# Patient Record
Sex: Male | Born: 1985 | Race: Black or African American | Hispanic: No | Marital: Single | Smoking: Never smoker
Health system: Southern US, Community
[De-identification: ages and names within clinical notes are randomized; demographics above are authoritative.]

## PROBLEM LIST (undated history)

## (undated) DIAGNOSIS — Z889 Allergy status to unspecified drugs, medicaments and biological substances status: Secondary | ICD-10-CM

## (undated) HISTORY — PX: WISDOM TOOTH EXTRACTION: SHX21

## (undated) HISTORY — DX: Allergy status to unspecified drugs, medicaments and biological substances: Z88.9

---

## 2012-11-28 ENCOUNTER — Encounter (HOSPITAL_COMMUNITY): Payer: Self-pay | Admitting: Emergency Medicine

## 2012-11-28 ENCOUNTER — Emergency Department (HOSPITAL_COMMUNITY): Payer: Self-pay

## 2012-11-28 ENCOUNTER — Emergency Department (HOSPITAL_COMMUNITY)
Admission: EM | Admit: 2012-11-28 | Discharge: 2012-11-28 | Disposition: A | Discharge status: Home / Self Care | Payer: Self-pay | Attending: Emergency Medicine | Admitting: Emergency Medicine

## 2012-11-28 DIAGNOSIS — S9031XA Contusion of right foot, initial encounter: Secondary | ICD-10-CM

## 2012-11-28 DIAGNOSIS — W208XXA Other cause of strike by thrown, projected or falling object, initial encounter: Secondary | ICD-10-CM | POA: Insufficient documentation

## 2012-11-28 DIAGNOSIS — Y9389 Activity, other specified: Secondary | ICD-10-CM | POA: Insufficient documentation

## 2012-11-28 DIAGNOSIS — S9030XA Contusion of unspecified foot, initial encounter: Secondary | ICD-10-CM | POA: Insufficient documentation

## 2012-11-28 DIAGNOSIS — Y929 Unspecified place or not applicable: Secondary | ICD-10-CM | POA: Insufficient documentation

## 2012-11-28 MED ORDER — HYDROCODONE-ACETAMINOPHEN 5-325 MG PO TABS: 2.0000 | ORAL_TABLET | Freq: Four times a day (QID) | ORAL | Status: DC | PRN

## 2012-11-28 NOTE — Discharge Instructions (Signed)
Contusion  A contusion is a deep bruise. Contusions happen when an injury causes bleeding under the skin. Signs of bruising include pain, puffiness (swelling), and discolored skin. The contusion may turn blue, purple, or yellow.  HOME CARE    Put ice on the injured area.   Put ice in a plastic bag.   Place a towel between your skin and the bag.   Leave the ice on for 15-20 minutes, 3-4 times a day.   Only take medicine as told by your doctor.   Rest the injured area.   If possible, raise (elevate) the injured area to lessen puffiness.  GET HELP RIGHT AWAY IF:    You have more bruising or puffiness.   You have pain that is getting worse.   Your puffiness or pain is not helped by medicine.  MAKE SURE YOU:    Understand these instructions.   Will watch your condition.   Will get help right away if you are not doing well or get worse.  Document Released: 07/02/2007 Document Revised: 04/07/2011 Document Reviewed: 11/18/2010  [employer] Patient Information 2014 [employer]
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
## 2012-11-28 NOTE — ED Provider Notes (Signed)
Medical screening examination/treatment/procedure(s) were performed by non-physician practitioner and as supervising physician I was immediately available for consultation/collaboration.  EKG Interpretation   None        Keyaira Clapham, MD 11/28/12 1600 
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
## 2014-03-06 ENCOUNTER — Encounter (HOSPITAL_COMMUNITY): Payer: Self-pay | Admitting: Emergency Medicine

## 2014-03-06 DIAGNOSIS — R0982 Postnasal drip: Secondary | ICD-10-CM | POA: Insufficient documentation

## 2014-03-06 DIAGNOSIS — R05 Cough: Secondary | ICD-10-CM | POA: Insufficient documentation

## 2014-03-06 DIAGNOSIS — R0981 Nasal congestion: Secondary | ICD-10-CM | POA: Insufficient documentation

## 2014-03-06 DIAGNOSIS — J3489 Other specified disorders of nose and nasal sinuses: Secondary | ICD-10-CM | POA: Insufficient documentation

## 2014-03-06 DIAGNOSIS — J029 Acute pharyngitis, unspecified: Secondary | ICD-10-CM | POA: Insufficient documentation

## 2014-03-06 LAB — RAPID STREP SCREEN (MED CTR MEBANE ONLY): STREPTOCOCCUS, GROUP A SCREEN (DIRECT): NEGATIVE

## 2014-03-06 NOTE — ED Notes (Signed)
Pt. reports sore throat for 2 weeks with occasional productive cough , respirations unlabored / airway intact , no fever or chills.

## 2014-03-07 ENCOUNTER — Emergency Department (HOSPITAL_COMMUNITY)
Admission: EM | Admit: 2014-03-07 | Discharge: 2014-03-07 | Disposition: A | Discharge status: Home / Self Care | Payer: Self-pay | Attending: Emergency Medicine | Admitting: Emergency Medicine

## 2014-03-07 DIAGNOSIS — J029 Acute pharyngitis, unspecified: Secondary | ICD-10-CM

## 2014-03-07 DIAGNOSIS — R0982 Postnasal drip: Secondary | ICD-10-CM

## 2014-03-07 MED ORDER — HYDROCODONE-ACETAMINOPHEN 7.5-325 MG/15ML PO SOLN
5.0000 mL | Freq: Once | ORAL | Status: AC
  Administered 2014-03-07: 5 mL via ORAL
  Filled 2014-03-07: qty 15

## 2014-03-07 MED ORDER — BENZOCAINE 20 % MT SOLN: 1.0000 "application " | Freq: Three times a day (TID) | OROMUCOSAL | Status: DC | PRN

## 2014-03-07 MED ORDER — FLUTICASONE PROPIONATE 50 MCG/ACT NA SUSP: 2.0000 | Freq: Every day | NASAL | Status: DC

## 2014-03-07 NOTE — ED Notes (Signed)
Patient is alert and orientedx4.  Patient was explained discharge instructions and they understood them with no questions.  The patient's friend, Cyril LoosenMoneisha Hamilton is taking the patient home.

## 2014-03-07 NOTE — ED Provider Notes (Signed)
CSN: [id number]     Arrival date & time 03/06/14  2301 History   First MD Initiated Contact with Patient 03/07/14 0010     Chief Complaint  Patient presents with  . Sore Throat     (Consider location/radiation/quality/duration/timing/severity/associated sxs/prior Treatment) The history is provided by the patient and medical records. No language interpreter was used.   Jordan Tran is a 29 y.o. male  with no major medical history presents to the Emergency Department complaining of gradual, persistent, sinus congestion, intermittent productive cough and sore throat onset 3 weeks ago.  She reports that at the onset he had multiple URI symptoms including rhinorrhea, sore throat, cough, otalgia, subjective chills. He reports that the symptoms lasted approximately one week and then resolved, but his sore throat and postnasal drip has persisted along with his intermittent cough.  She reports trying over-the-counter treatments without relief. He reports ibuprofen without relief from his sore throat. He has not tried nasal spray or Mucinex. Nothing seems to make his symptoms better or worse. Patient denies fever, chills, headache, neck pain, neck stiffness, chest pain, shortness of breath, nausea, vomiting, diarrhea, weakness.     History reviewed. No pertinent past medical history. History reviewed. No pertinent past surgical history. Family History  Problem Relation Age of Onset  . Hypertension Other   . Diabetes Other    History  Substance Use Topics  . Smoking status: Never Smoker   . Smokeless tobacco: Not on file  . Alcohol Use: Yes    Review of Systems  Constitutional: Negative for fever, chills, appetite change and fatigue.  HENT: Positive for congestion, postnasal drip and sore throat. Negative for ear discharge, ear pain, mouth sores, rhinorrhea and sinus pressure.   Eyes: Negative for visual disturbance.  Respiratory: Positive for cough (productive). Negative for chest tightness,  shortness of breath, wheezing and stridor.   Cardiovascular: Negative for chest pain, palpitations and leg swelling.  Gastrointestinal: Negative for nausea, vomiting, abdominal pain and diarrhea.  Genitourinary: Negative for dysuria, urgency, frequency and hematuria.  Musculoskeletal: Negative for myalgias, back pain, arthralgias and neck stiffness.  Skin: Negative for rash.  Neurological: Negative for syncope, light-headedness, numbness and headaches.  Hematological: Negative for adenopathy.  Psychiatric/Behavioral: The patient is not nervous/anxious.   All other systems reviewed and are negative.     Allergies  Review of patient's allergies indicates no known allergies.  Home Medications   Prior to Admission medications   Medication Sig Start Date End Date Taking? Authorizing Provider  benzocaine (HURRICAINE) 20 % SOLN Use as directed 1 application in the mouth or throat 3 (three) times daily as needed. As needed for sore throat 03/07/14   Dahlia Client Wyat Infinger, PA-C  fluticasone (FLONASE) 50 MCG/ACT nasal spray Place 2 sprays into both nostrils daily. 03/07/14   Dyasia Firestine, PA-C  HYDROcodone-acetaminophen (NORCO/VICODIN) 5-325 MG per tablet Take 2 tablets by mouth every 6 (six) hours as needed for pain. 11/28/12   Teressa Lower, NP   BP 153/99 mmHg  Pulse 90  Temp(Src) 98.4 F (36.9 C)  Resp 20  SpO2 92% Physical Exam  Constitutional: He is oriented to person, place, and time. He appears well-developed and well-nourished. No distress.  HENT:  Head: Normocephalic and atraumatic.  Right Ear: Tympanic membrane, external ear and ear canal normal.  Left Ear: Tympanic membrane, external ear and ear canal normal.  Nose: Mucosal edema and rhinorrhea present. No epistaxis. Right sinus exhibits no maxillary sinus tenderness and no frontal sinus tenderness. Left sinus exhibits  no maxillary sinus tenderness and no frontal sinus tenderness.  Mouth/Throat: Uvula is midline and mucous  membranes are normal. Mucous membranes are not pale, not dry and not cyanotic. No trismus in the jaw. No uvula swelling. Posterior oropharyngeal erythema present. No oropharyngeal exudate, posterior oropharyngeal edema or tonsillar abscesses.  Mild posterior oropharyngeal erythema without edema or exudate  Eyes: Conjunctivae are normal. Pupils are equal, round, and reactive to light.  Neck: Normal range of motion, full passive range of motion without pain and phonation normal. No tracheal tenderness, no spinous process tenderness and no muscular tenderness present. No rigidity. No erythema and normal range of motion present. No Brudzinski's sign and no Kernig's sign noted.  Range of motion without pain no No midline or paraspinal tenderness Normal phonation No stridor Handling secretions without difficulty No nuchal rigidity or meningeal signs  Cardiovascular: Normal rate, regular rhythm, normal heart sounds and intact distal pulses.   Pulses:      Radial pulses are 2+ on the right side, and 2+ on the left side.  Pulmonary/Chest: Effort normal and breath sounds normal. No stridor. No respiratory distress. He has no decreased breath sounds. He has no wheezes.  Clear and equal breath sounds without focal wheezes, rhonchi, rales  Abdominal: Soft. Bowel sounds are normal. There is no tenderness.  Musculoskeletal: Normal range of motion.  Lymphadenopathy:       Head (right side): No submental, no submandibular, no tonsillar, no preauricular, no posterior auricular and no occipital adenopathy present.       Head (left side): No submental, no submandibular, no tonsillar, no preauricular, no posterior auricular and no occipital adenopathy present.    He has no cervical adenopathy.       Right cervical: No superficial cervical, no deep cervical and no posterior cervical adenopathy present.      Left cervical: No superficial cervical, no deep cervical and no posterior cervical adenopathy present.   Neurological: He is alert and oriented to person, place, and time.  Alert and oriented Moves all extremities without ataxia  Skin: Skin is warm and dry. No rash noted. He is not diaphoretic.  Psychiatric: He has a normal mood and affect.  Nursing note and vitals reviewed.   ED Course  Procedures (including critical care time) Labs Review Labs Reviewed  RAPID STREP SCREEN  CULTURE, GROUP A STREP    Imaging Review No results found.   EKG Interpretation None      MDM   Final diagnoses:  Post-nasal drip  Sore throat   Jordan Tran presents with persistent postnasal drip, sore throat and occasional productive cough approximately 2 weeks after URI symptoms.  patient without lateralizing sinus tenderness. No concern for acute sinusitis. Patient is afebrile, lung sounds are clear and equal, doubt pneumonia. Patient's throat with mild erythema, it appears irritated. Patient with persistent postnasal drip, occasional productive cough with persistent nasal drip likely secondary to his URI. Discussed that postnasal drip and cough from bronchitis or URI can be persistent. No evidence of acute infection. No indication for antibiotics.    I have personally reviewed patient's vitals, nursing note and any pertinent labs or imaging.  I performed an focused physical exam; undressed when appropriate .    It has been determined that no acute conditions requiring further emergency intervention are present at this time. The patient/guardian have been advised of the diagnosis and plan. I reviewed any labs and imaging including any potential incidental findings. We have discussed signs and symptoms that warrant  return to the ED and they are listed in the discharge instructions.    Vital signs are stable at discharge.   BP 153/99 mmHg  Pulse 90  Temp(Src) 98.4 F (36.9 C)  Resp 20  SpO2 92%     Dierdre Forth, PA-C 03/07/14 [id number]  Gwyneth Sprout, MD 03/07/14 [phone]

## 2014-03-07 NOTE — Discharge Instructions (Signed)
1. Medications: flonase, hurricane gel, mucinex,usual home medications 2. Treatment: rest, drink plenty of fluids, take tylenol or ibuprofen for fever control 3. Follow Up: Please followup with your primary doctor in 3 days for discussion of your diagnoses and further evaluation after today's visit; if you do not have a primary care doctor use the resource guide provided to find one; Return to the ER for high fevers, difficulty breathing or other concerning symptoms   Emergency Department Resource Guide 1) Find a Doctor and Pay Out of Pocket Although you won't have to find out who is covered by your insurance plan, it is a good idea to ask around and get recommendations. You will then need to call the office and see if the doctor you have chosen will accept you as a new patient and what types of options they offer for patients who are self-pay. Some doctors offer discounts or will set up payment plans for their patients who do not have insurance, but you will need to ask so you aren't surprised when you get to your appointment.  2) Contact Your Local Health Department Not all health departments have doctors that can see patients for sick visits, but many do, so it is worth a call to see if yours does. If you don't know where your local health department is, you can check in your phone book. The CDC also has a tool to help you locate your state's health department, and many state websites also have listings of all of their local health departments.  3) Find a Walk-in Clinic If your illness is not likely to be very severe or complicated, you may want to try a walk in clinic. These are popping up all over the country in pharmacies, drugstores, and shopping centers. They're usually staffed by nurse practitioners or physician assistants that have been trained to treat common illnesses and complaints. They're usually fairly quick and inexpensive. However, if you have serious medical issues or chronic medical  problems, these are probably not your best option.  No Primary Care Doctor: - Call Health Connect at  [phone] - they can help you locate a primary care doctor that  accepts your insurance, provides certain services, etc. - Physician Referral Service- [phone]  Chronic Pain Problems: Organization         Address  Phone   Notes  [facility]  [phone] Patients need to be referred by their primary care doctor.   Medication Assistance: Organization         Address  Phone   Notes  [facility] [address] [place], [place] [address] [phone] --Must be a resident of [facility] -- Must have NO insurance coverage whatsoever (no Medicaid/ Medicare, etc.) -- The pt. MUST have a primary care doctor that directs their care regularly and follows them in the community   Med[facility]ist  [phone]   [employer]  [phone]    Agencies that provide inexpensive medical care: Organization         Address  Phone   Notes  [facility]  [phone]) [phone]   [facility]    [phone]   [facility] [address] [place], [place] [address] [phone]   [facility] [address], [place] [phone]) [phone]   [facility]    [phone]   [facility]    [phone]  [facility] [place]  [place] [address], [place] Phone:  [phone], Fax:  [phone] Hours of Operation:  9 am - 6 pm, M-F.  Also accepts Medicaid/Medicare and self-pay.  [facility] for [place] [address], [address], [place] Phone: [phone], Fax: [phone]. Hours of Operation:  8:30 am - 5:30 pm, M-F.  Also accepts Medicaid and self-pay.  [facility] [place] [address] [place] Phone: [phone]   [place], [place], [place] [phone], Ext. [phone]  Mondays & Thursdays: 7-9 AM.  First 15 patients are seen on a first come, first serve basis.    [place] Providers:  Organization         Address  Phone   Notes  [facility] [address], [place] [phone] Also accepts self-pay patients.  [facility] [address], [place]  [phone]   [place], [address], [place] [phone]   [facility] [address], [place] [phone]   Lucianne Lei [address], [place]   [phone] Only accepts [place] Access [place] patients after they have their name applied to their card.   Self-Pay (no insurance) in [facility]:  Organization         Address  Phone   Notes  Sickle Cell Patients, [facility] [place] [phone]   [facility] [place] [phone]   [facility] [place]  [place], [place], [place] [phone]   [facility]/Dr. Osei-Bonsu  [address] [place] or [address], [place] [phone] Phone number for both [place] and [place] locations is the same.  [facility] [address], [place] [phone]   [facility] [place] [address], [place] or [address] [phone]   [facility] [address] [place] [phone], phone; [phone], fax Sees patients 1st and 3rd Saturday of every month.  Must not qualify for public or private insurance (i.e. Medicaid, Medicare, [place] Health Choice, Veterans' Benefits)  Household income should be no more than 200% of the poverty level The clinic cannot treat you if you are pregnant or think you are pregnant  Sexually transmitted diseases are not treated at  the clinic.    Dental Care: Organization         Address  Phone  Notes  [facility] [place] [phone] Accepts children up to age 25 who are enrolled in [place] or [place]; pregnant women with a Medicaid card; and children who have applied for Medicaid or [place] Health Choice, but were declined, whose parents can pay a reduced fee at time of service.  [facility] Department of [facility]  [address], [place] [phone] Accepts children up to age 67 who are enrolled in [place] or [place]; pregnant women with a Medicaid card; and children who have applied for Medicaid or  Health Choice, but were declined, whose parents can pay a reduced fee at time of service.  [place]  [phone]  [place] [phone] Patients are seen by appointment only. Walk-ins are not accepted. [place] will see patients 46 years of age and older. Monday - Tuesday (8am-5pm) Most Wednesdays (8:30-5pm) $30 per visit, cash only  [facility] Adult [facility] Access PROGRAM  [address], [facility] [phone] Patients are seen by appointment only. Walk-ins are not accepted. [place] will see patients 10 years of age and older. One Wednesday Evening (Monthly: Volunteer Based).  $30 per visit, cash only  [place]  [phone] for adults; Children under age 30, call Graduate Pediatric Dentistry at [phone]. Children aged 7-14, please call [phone] to request a pediatric application.  Dental services are provided in all areas of dental care including fillings, crowns and bridges, complete and partial dentures, implants, gum treatment, root canals, and extractions. Preventive care is also provided. Treatment is provided to both adults and children. Patients are selected via a lottery and there is often a  waiting list.   [facility] [address], [place]  [phone] [email or web address]   [facility] [address] [place], [place] [phone], Ext. [phone] Second and Fourth Thursday of each month, opens at 6:30 AM; Clinic ends at 9 AM.  Patients are seen on a first-come first-served basis, and a limited number are seen during each clinic.   [facility]  [address] [place] D[place]er [place], [place] [phone]   Eligibility Requirements You must have lived in [place], [place], or [place] counties for at least the last three months.   You cannot be eligible for state or federal sponsored [employer], including [employer], [place], or [employer].   You generally cannot be eligible for healthcare insurance through your employer.    How to apply: Eligibility screenings are held every Tuesday and Wednesday afternoon from 1:00 pm until 4:00 pm. You do not need an appointment for the interview!  [facility] [address] [place], [place]   [place]  [place] Department  [place]  [phone]    Behavioral Health Resources in the Community: Intensive Outpatient Programs Organization         Address  Phone  Notes  [place] [place], [place] [phone]   [facility] [address] [place], [place]   [facility]: [facility] [address] [place], [place]   [place] [address]  [place], [place] or [phone]   Substance Abuse Resources Organization         Address  Phone  Notes  [facility]  [phone]   [place]  [phone]   [facility] [place]   Chinita Pester  [phone]   Residential & Outpatient Substance Abuse  Program  [phone]   Psychological Services Organization         Address  Phone  Notes  [facility] [place]  [place]  [phone]   [place] [address], [place] or [phone]    Mobile Crisis Teams Organization         Address  Phone  Notes  [facility], Mobile Crisis Care Unit  [phone]   [facility]  [address] [place], [place]  Sharon DeEsch [address] °[place] [phone]   ° °Self-Help/Support Groups °Organization         Address  Phone             Notes  °[facility] [place] - variety of support groups  336- [phone] Call for more information  °Narcotics Anonymous (NA), [facility] [address], °[place]  2 meetings at this location  ° °Residential Treatment Programs °Organization         Address  Phone  Notes  °[facility] [address],    °[place]  [phone]   °[facility] ° [address], [place], [place] [phone]   °[facility] [address], [place] [phone] Admissions: 8am-3pm M-F  °[facility] [address]    °[place], [place] [phone]   °[facility] [address], [place], [place] [phone]   °[facility] [address]  °[place], [place] [phone]   °[facility] - Intensive Outpatient [address], [place], [place] [phone]   °[facility] [address]  °[place], [place] [phone] or [phone]   °[facility] ([facility]) [address] [place], [place] [phone] Accepts Medicaid  °[facility] [address]  °[place] 1-800-659-3381 Substance Abuse/Addiction Treatment  ° °[place] Behavioral Health Resources °Organization          Address  Phone  Notes  °[facility]  ([phone]) [phone]   °Julie Brannon, PhD [address] [place], [place]   (336) [phone] or (336) [phone]   °[facility]   [address] °[place], [place] ([phone]) [phone]   °[facility] [address], [place], [place] ([phone]) [phone] Insurance/Medicaid/sponsorship through [facility]  °[facility] [address]                                    [place], [place] [phone]) [phone] Therapy/tele-psych/case  °[facility] [address]  ° [place], [place] ([phone]) [phone]    °Dr. Arfeen  ([phone]) [phone]   °[facility] [place]  [employer] [place] [facility] 1) [address], [place] °2) [address], [place] °3)  [address], [place] (336) [phone] °(336) [phone] ° °(336) [phone]   °[place] Child Abuse Hotline (336) 342-1394 or (336) [phone] (After Hours)    ° ° ° ° °

## 2014-03-08 LAB — CULTURE, GROUP A STREP

## 2014-08-15 ENCOUNTER — Emergency Department (HOSPITAL_COMMUNITY)
Admission: EM | Admit: 2014-08-15 | Discharge: 2014-08-15 | Disposition: A | Discharge status: Home / Self Care | Payer: Self-pay | Attending: Emergency Medicine | Admitting: Emergency Medicine

## 2014-08-15 ENCOUNTER — Encounter (HOSPITAL_COMMUNITY): Payer: Self-pay | Admitting: Emergency Medicine

## 2014-08-15 DIAGNOSIS — L03114 Cellulitis of left upper limb: Secondary | ICD-10-CM | POA: Insufficient documentation

## 2014-08-15 DIAGNOSIS — L309 Dermatitis, unspecified: Secondary | ICD-10-CM | POA: Insufficient documentation

## 2014-08-15 MED ORDER — CEPHALEXIN 500 MG PO CAPS: 500.0000 mg | ORAL_CAPSULE | Freq: Four times a day (QID) | ORAL | Status: DC

## 2014-08-15 NOTE — Discharge Instructions (Signed)
Take your antibiotics as directed. Please follow-up with your primary care for further valuation management of your symptoms. Return to ED for any worsening symptoms.  Cellulitis Cellulitis is an infection of the skin and the tissue beneath it. The infected area is usually red and tender. Cellulitis occurs most often in the arms and lower legs.  CAUSES  Cellulitis is caused by bacteria that enter the skin through cracks or cuts in the skin. The most common types of bacteria that cause cellulitis are staphylococci and streptococci. SIGNS AND SYMPTOMS   Redness and warmth.  Swelling.  Tenderness or pain.  Fever. DIAGNOSIS  Your health care provider can usually determine what is wrong based on a physical exam. Blood tests may also be done. TREATMENT  Treatment usually involves taking an antibiotic medicine. HOME CARE INSTRUCTIONS   Take your antibiotic medicine as directed by your health care provider. Finish the antibiotic even if you start to feel better.  Keep the infected arm or leg elevated to reduce swelling.  Apply a warm cloth to the affected area up to 4 times per day to relieve pain.  Take medicines only as directed by your health care provider.  Keep all follow-up visits as directed by your health care provider. SEEK MEDICAL CARE IF:   You notice red streaks coming from the infected area.  Your red area gets larger or turns dark in color.  Your bone or joint underneath the infected area becomes painful after the skin has healed.  Your infection returns in the same area or another area.  You notice a swollen bump in the infected area.  You develop new symptoms.  You have a fever. SEEK IMMEDIATE MEDICAL CARE IF:   You feel very sleepy.  You develop vomiting or diarrhea.  You have a general ill feeling (malaise) with muscle aches and pains. MAKE SURE YOU:   Understand these instructions.  Will watch your condition.  Will get help right away if you are  not doing well or get worse. Document Released: 10/23/2004 Document Revised: 05/30/2013 Document Reviewed: 03/31/2011 [facility] Patient Information 2015 [place], [place]. This information is not intended to replace advice given to you by your health care provider. Make sure you discuss any questions you have with your health care provider.

## 2014-08-15 NOTE — ED Notes (Signed)
Pt. reports insect bite at left forearm yesterday with itching/swelling and mild drainage .

## 2014-08-15 NOTE — ED Provider Notes (Signed)
CSN: [id number]     Arrival date & time 08/15/14  2140 History  This chart was scribed for Joycie Peek, PA-C, working with Margarita Grizzle, MD by Chestine Spore, ED Scribe. The patient was seen in room TR03C/TR03C at 10:07 PM.    Chief Complaint  Patient presents with  . Insect Bite     The history is provided by the patient. No language interpreter was used.    HPI Comments: Jordan Tran is a 29 y.o. male who presents to the Emergency Department complaining of insect bite onset 3 days. Pt reports that he initally had itching as his symptoms that he scratched the area and had a resulting "mound" to his arm. Pt notes that he thinks that he was bit by an insect. Pt notes that he bumped into a wall and accidentally opened up the wound and it has been draining since. He states that he has tried neosporin with no relief for his symptoms. He denies fever, chills, abdominal pain, n/v, and any other symptoms. Denies medical hx of DM or HTN. Denies allergies to any medications.     History reviewed. No pertinent past medical history. History reviewed. No pertinent past surgical history. Family History  Problem Relation Age of Onset  . Hypertension Other   . Diabetes Other    History  Substance Use Topics  . Smoking status: Never Smoker   . Smokeless tobacco: Not on file  . Alcohol Use: Yes    Review of Systems  Constitutional: Negative for fever and chills.  Gastrointestinal: Negative for nausea, vomiting and abdominal pain.  Skin: Positive for color change.       "mound" to left forearm      Allergies  Review of patient's allergies indicates no known allergies.  Home Medications   Prior to Admission medications   Medication Sig Start Date End Date Taking? Authorizing Provider  benzocaine (HURRICAINE) 20 % SOLN Use as directed 1 application in the mouth or throat 3 (three) times daily as needed. As needed for sore throat Patient not taking: Reported on 08/15/2014 03/07/14   [place]ahlia Client  Muthersbaugh, PA-C  cephALEXin (KEFLEX) 500 MG capsule Take 1 capsule (500 mg total) by mouth 4 (four) times daily. 08/15/14   Joycie Peek, PA-C  fluticasone (FLONASE) 50 MCG/ACT nasal spray Place 2 sprays into both nostrils daily. Patient not taking: Reported on 08/15/2014 03/07/14   [place]ia Client Muthersbaugh, PA-C  HYDROcodone-acetaminophen (NORCO/VICODIN) 5-325 MG per tablet Take 2 tablets by mouth every 6 (six) hours as needed for pain. Patient not taking: Reported on 08/15/2014 11/28/12   Teressa Lower, NP   BP 120/76 mmHg  Pulse 89  Temp(Src) 98.1 F (36.7 C) (Oral)  Resp 14  Ht 6' (1.829 m)  Wt 352 lb (159.666 kg)  BMI 47.73 kg/m2  SpO2 97% Physical Exam  Constitutional: He is oriented to person, place, and time. He appears well-developed and well-nourished. No distress.  HENT:  Head: Normocephalic and atraumatic.  Eyes: EOM are normal.  Neck: Neck supple. No tracheal deviation present.  Cardiovascular: Normal rate, regular rhythm and normal heart sounds.  Exam reveals no gallop and no friction rub.   No murmur heard. Pulmonary/Chest: Effort normal and breath sounds normal. No respiratory distress. He has no wheezes. He has no rales.  Abdominal: Soft. There is no tenderness.  Musculoskeletal: Normal range of motion.  Neurological: He is alert and oriented to person, place, and time.  Skin: Skin is warm and dry. There is erythema.  Small raised  lesion, approximately 2 cm in diameter, noted to the anterior aspect of the mid left forearm. Mild induration with surrounding erythema that is tender to touch. No active drainage  Psychiatric: He has a normal mood and affect. His behavior is normal.  Nursing note and vitals reviewed.   ED Course  Procedures (including critical care time) EMERGENCY DEPARTMENT US SOFT TISSUE INTERPRETATION "Study: Limited Ultrasound of the noted body part in comments below"  INDICATIONS: Soft tissue infection Multiple views of the body part are  obtained with a multi-frequency linear probe  PERFORMED BY:  Myself  IMAGES ARCHIVED?: Yes  SIDE:Left  BODY PART:Upper extremity  FINDINGS: No abcess noted  LIMITATIONS:  Body Habitus  INTERPRETATION:  Cellulitis present  COMMENT:  Cellulitis without obvious abscess.   DIAGNOSTIC STUDIES: Oxygen Saturation is 97% on RA, nl by my interpretation.    COORDINATION OF CARE: 10:10 PM-Discussed treatment plan with pt at bedside and pt agreed to plan.   Labs Review Labs Reviewed - No data to display  Imaging Review No results found.   EKG Interpretation None     Meds given in ED:  Medications - No data to display  Discharge Medication List as of 08/15/2014 11:02 PM    START taking these medications   Details  cephALEXin (KEFLEX) 500 MG capsule Take 1 capsule (500 mg total) by mouth 4 (four) times daily., Starting 08/15/2014, Until Discontinued, Print       Filed Vitals:   08/15/14 2148 08/15/14 2305  BP: 144/102 120/76  Pulse: 98 89  Temp: 98.8 F (37.1 C) 98.1 F (36.7 C)  TempSrc: Oral Oral  Resp: 14   Height: 6' (1.829 m)   Weight: 352 lb (159.666 kg)   SpO2: 97% 97%    MDM  Vitals stable - WNL -afebrile Pt resting comfortably in ED. PE--no evidence of drainable abscess on exam. Patient with cellulitis confirmed on ultrasound. Also, clinically with mild cellulitis.  Will DC with antibiotics, instructed to follow-up with primary care for reevaluation. I discussed all relevant lab findings and imaging results with pt and they verbalized understanding. Discussed f/u with PCP within 48 hrs and return precautions, pt very amenable to plan.  Final diagnoses:  Cellulitis of left upper extremity   I personally performed the services described in this documentation, which was scribed in my presence. The recorded information has been reviewed and is accurate.    Joycie PeekBenjamin Charlii Yost, PA-C 08/16/14 [id number]  Margarita Grizzleanielle Ray, MD 08/17/14 [id number]

## 2014-08-15 NOTE — ED Notes (Signed)
A&OX4, ambulatory at d/c with steady gait, NAD

## 2014-10-22 IMAGING — CR DG FOOT COMPLETE 3+V*R*
3 series · 3 of 3 positions shown · non-contrast
Comparison: None.

CLINICAL DATA: Initial encounter for right foot injury, a table
fell on top of the foot, pain localizing to the 3rd through 5th
metatarsals.

EXAM:
RIGHT FOOT COMPLETE - 3+ VIEW

[x foot ap right]
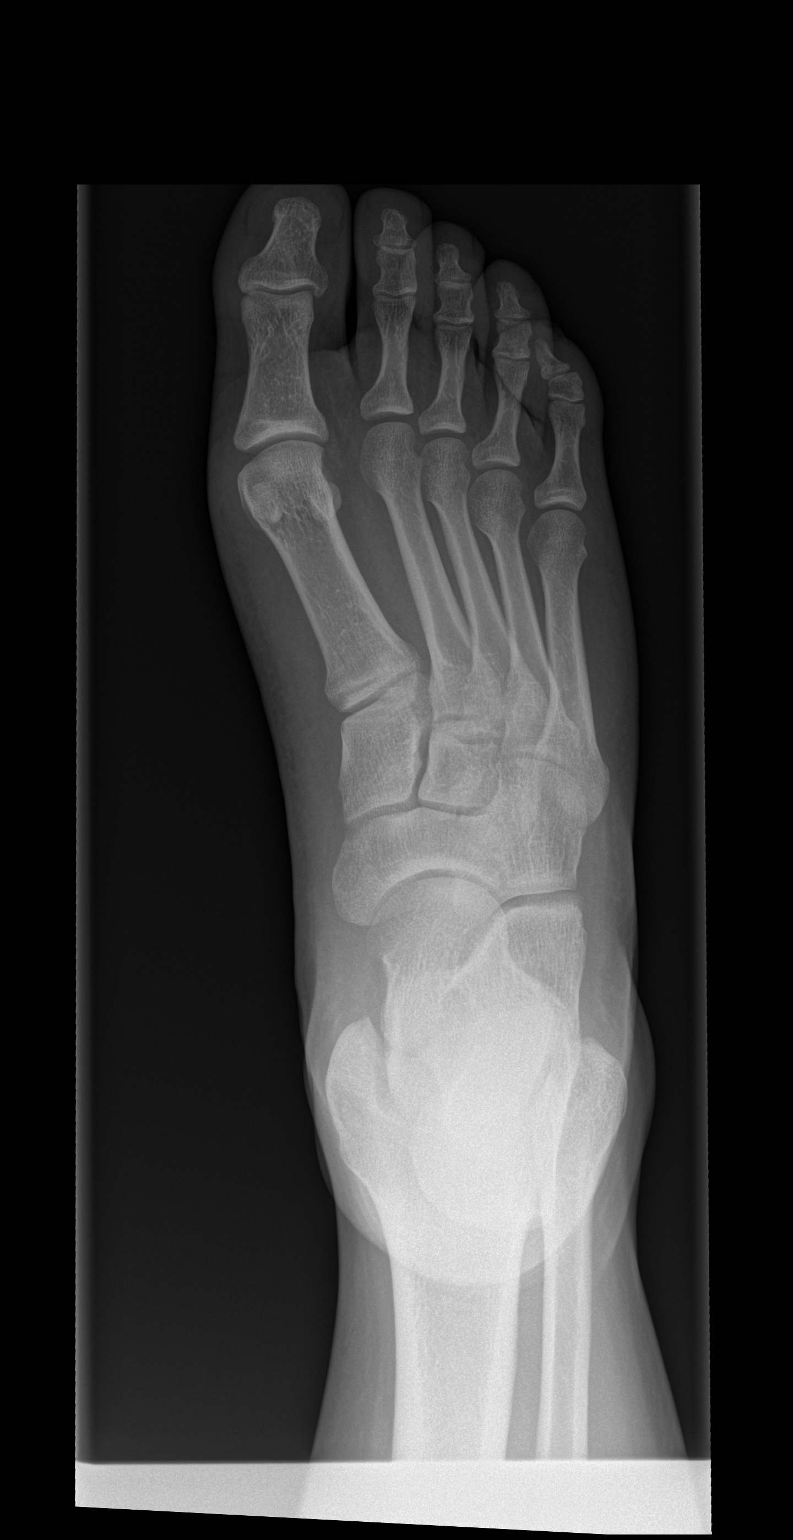

[x foot obl right]
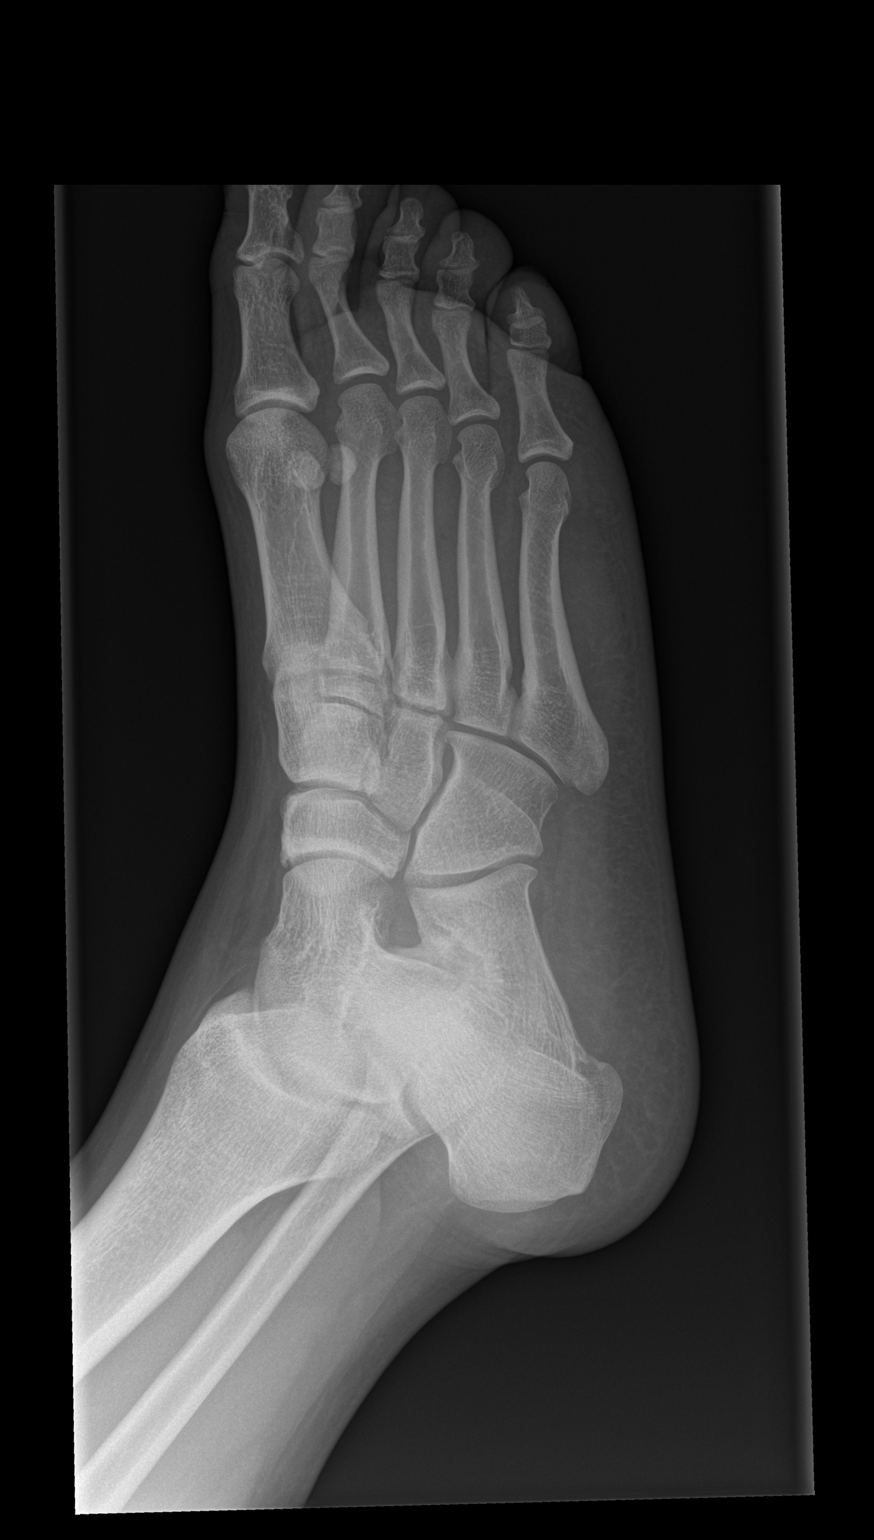

[x foot lat right]
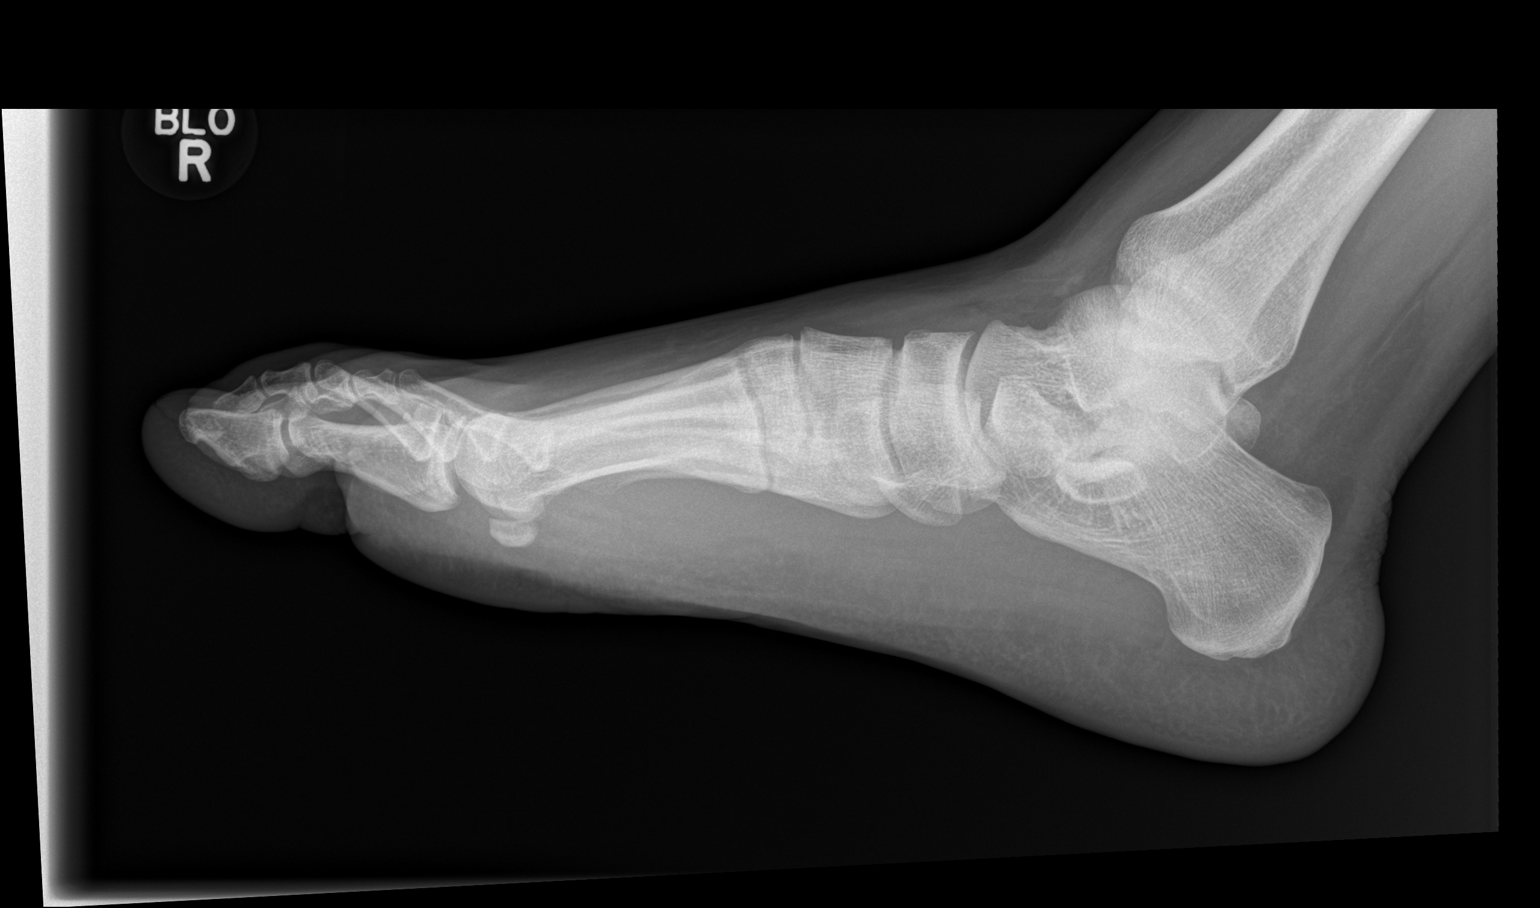

[3 of 3 positions shown; findings below may reference images not displayed]

FINDINGS: No evidence of acute fracture or dislocation. Joint spaces well
preserved. Well-preserved bone mineral density. No intrinsic osseous
abnormalities.
IMPRESSION: Normal examination.

## 2020-03-23 ENCOUNTER — Encounter (HOSPITAL_COMMUNITY): Payer: Self-pay

## 2020-03-23 ENCOUNTER — Emergency Department (HOSPITAL_COMMUNITY)
Admission: EM | Admit: 2020-03-23 | Discharge: 2020-03-23 | Disposition: A | Discharge status: Home / Self Care | Payer: Self-pay | Attending: Emergency Medicine | Admitting: Emergency Medicine

## 2020-03-23 ENCOUNTER — Other Ambulatory Visit: Payer: Self-pay

## 2020-03-23 DIAGNOSIS — L03211 Cellulitis of face: Secondary | ICD-10-CM | POA: Insufficient documentation

## 2020-03-23 MED ORDER — CEPHALEXIN 500 MG PO CAPS: 500.0000 mg | ORAL_CAPSULE | Freq: Three times a day (TID) | ORAL | 0 refills | Status: AC

## 2020-03-23 NOTE — Discharge Instructions (Addendum)
Call your primary care doctor or specialist as discussed in the next 2-3 days.   Return immediately back to the ER if:  Your symptoms worsen within the next 12-24 hours. You develop new symptoms such as new fevers, persistent vomiting, new pain, shortness of breath, or new weakness or numbness, or if you have any other concerns.  

## 2020-03-23 NOTE — ED Triage Notes (Signed)
Pt reports his nose began swelling on Monday and reports taking Benadryl regularly with no relief. Pt also endorses bilateral jaw soreness.

## 2020-03-23 NOTE — ED Provider Notes (Addendum)
[facility]-EMERGENCY DEPT Provider Note   CSN: [id number] Arrival date & time: 03/23/20  1037     History Chief Complaint  Patient presents with  . Facial Swelling    Jordan Tran is a 35 y.o. male.  Patient presents with complaint of swelling and tenderness to tip of his nose.  He states he had a similar symptom about 2 years ago that resolved after few days.  About 2 days ago he states he accidentally scraped the tip of his nose while in the bathroom.  Since then he has noticed increased redness swelling and pain in that region.  Denies fevers or cough or vomiting or diarrhea.        History reviewed. No pertinent past medical history.  There are no problems to display for this patient.   History reviewed. No pertinent surgical history.     Family History  Problem Relation Age of Onset  . Hypertension Other   . Diabetes Other     Social History   Tobacco Use  . Smoking status: Never Smoker  Substance Use Topics  . Alcohol use: Yes  . Drug use: No    Home Medications Prior to Admission medications   Medication Sig Start Date End Date Taking? Authorizing Provider  benzocaine (HURRICAINE) 20 % SOLN Use as directed 1 application in the mouth or throat 3 (three) times daily as needed. As needed for sore throat Patient not taking: Reported on 08/15/2014 03/07/14   Muthersbaugh, Dahlia Client, PA-C  cephALEXin (KEFLEX) 500 MG capsule Take 1 capsule (500 mg total) by mouth 3 (three) times daily for 7 days. 03/23/20 03/30/20  Cheryll Cockayne, MD  fluticasone (FLONASE) 50 MCG/ACT nasal spray Place 2 sprays into both nostrils daily. Patient not taking: Reported on 08/15/2014 03/07/14   Muthersbaugh, Dahlia Client, PA-C  HYDROcodone-acetaminophen (NORCO/VICODIN) 5-325 MG per tablet Take 2 tablets by mouth every 6 (six) hours as needed for pain. Patient not taking: Reported on 08/15/2014 11/28/12   Teressa Lower, NP    Allergies    Patient has no known  allergies.  Review of Systems   Review of Systems  Constitutional: Negative for fever.  HENT: Negative for ear pain and sore throat.   Eyes: Negative for pain.  Respiratory: Negative for cough.   Cardiovascular: Negative for chest pain.  Gastrointestinal: Negative for abdominal pain.  Genitourinary: Negative for flank pain.  Musculoskeletal: Negative for back pain.  Skin: Positive for rash. Negative for color change.  Neurological: Negative for syncope.  All other systems reviewed and are negative.   Physical Exam Updated Vital Signs BP (!) 157/94 (BP Location: Left Arm)   Pulse 86   Temp 98.9 F (37.2 C) (Oral)   Resp 19   SpO2 100%   Physical Exam Constitutional:      General: He is not in acute distress.    Appearance: He is well-developed.  HENT:     Head: Normocephalic.     Nose:     Comments: Erythema at the tip of the nose proximately 1 x 1.5 cm, with increased warmth and mild skin abrasion.  No fluctuant lesion no abscess no vesicle noted. Eyes:     Extraocular Movements: Extraocular movements intact.  Cardiovascular:     Rate and Rhythm: Normal rate.  Pulmonary:     Effort: Pulmonary effort is normal.  Skin:    Coloration: Skin is not jaundiced.  Neurological:     Mental Status: He is alert. Mental status is at baseline.  ED Results / Procedures / Treatments   Labs (all labs ordered are listed, but only abnormal results are displayed) Labs Reviewed - No data to display  EKG None  Radiology No results found.  Procedures Procedures   Medications Ordered in ED Medications - No data to display  ED Course  I have reviewed the triage vital signs and the nursing notes.  Pertinent labs & imaging results that were available during my care of the patient were reviewed by me and considered in my medical decision making (see chart for details).    MDM Rules/Calculators/A&P                          Concern for developing cellulitis in his nose.   Given prescription of antibiotics to go home with.  Advised follow-up with his doctor within 4 to 5 days.  Advised immediate return for worsening symptoms fevers pain or any additional concerns.  Final Clinical Impression(s) / ED Diagnoses Final diagnoses:  Facial cellulitis    Rx / DC Orders ED Discharge Orders         Ordered    cephALEXin (KEFLEX) 500 MG capsule  3 times daily        03/23/20 1131           [place], MD 03/23/20 1131    Cheryll Cockayne, MD 03/23/20 1154

## 2021-12-23 ENCOUNTER — Ambulatory Visit: Payer: Self-pay | Admitting: Physician Assistant

## 2021-12-25 ENCOUNTER — Ambulatory Visit (INDEPENDENT_AMBULATORY_CARE_PROVIDER_SITE_OTHER): Payer: Medicaid Other | Admitting: Physician Assistant

## 2021-12-25 ENCOUNTER — Encounter: Payer: Self-pay | Admitting: Physician Assistant

## 2021-12-25 VITALS — BP 144/94 | HR 85 | Temp 97.8°F | Ht 72.05 in | Wt 299.8 lb

## 2021-12-25 DIAGNOSIS — Z8249 Family history of ischemic heart disease and other diseases of the circulatory system: Secondary | ICD-10-CM

## 2021-12-25 DIAGNOSIS — Z0000 Encounter for general adult medical examination without abnormal findings: Secondary | ICD-10-CM | POA: Diagnosis not present

## 2021-12-25 DIAGNOSIS — R030 Elevated blood-pressure reading, without diagnosis of hypertension: Secondary | ICD-10-CM | POA: Insufficient documentation

## 2021-12-25 NOTE — Patient Instructions (Signed)
Welcome to [facility] at [facility]! It was a pleasure meeting you today.  As discussed, Please schedule a 2 week follow up visit today.  PLEASE NOTE:  If you had any LAB tests please let us know if you have not heard back within a few days. You may see your results on MyChart before we have a chance to review them but we will give you a call once they are reviewed by us. If we ordered any REFERRALS today, please let us know if you have not heard from their office within the next two weeks. Let us know through MyChart if you are needing REFILLS, or have your pharmacy send us the request. You can also use MyChart to communicate with me or any office staff.  Please try these tips to maintain a healthy lifestyle:  Eat most of your calories during the day when you are active. Eliminate processed foods including packaged sweets (pies, cakes, cookies), reduce intake of potatoes, white bread, white pasta, and white rice. Look for whole grain options, oat flour or almond flour.  Each meal should contain half fruits/vegetables, one quarter protein, and one quarter carbs (no bigger than a computer mouse).  Cut down on sweet beverages. This includes juice, soda, and sweet tea. Also watch fruit intake, though this is a healthier sweet option, it still contains natural sugar! Limit to 3 servings daily.  Drink at least 1 glass of water with each meal and aim for at least 8 glasses (64 ounces) per day.  Exercise at least 150 minutes every week to the best of your ability.    Take Care,  Saagar Tortorella, PA-C    

## 2021-12-25 NOTE — Assessment & Plan Note (Signed)
Age-appropriate screening and counseling performed today. Will check labs (next visit, patient needle-phobic) and call with results. Preventive measures discussed and printed in AVS for patient.   Patient Counseling: [x]   Nutrition: Stressed importance of moderation in sodium/caffeine intake, saturated fat and cholesterol, caloric balance, sufficient intake of fresh fruits, vegetables, and fiber.  [x]   Stressed the importance of regular exercise.   [x]   Substance Abuse: Discussed cessation/primary prevention of tobacco, alcohol, or other drug use; driving or other dangerous activities under the influence; availability of treatment for abuse.   []   Injury prevention: Discussed safety belts, safety helmets, smoke detector, smoking near bedding or upholstery.   [x]   Sexuality: Discussed sexually transmitted diseases, partner selection, use of condoms, avoidance of unintended pregnancy  and contraceptive alternatives.   [x]   Dental health: Discussed importance of regular tooth brushing, flossing, and dental visits.  [x]   Health maintenance and immunizations reviewed. Please refer to Health maintenance section.

## 2021-12-25 NOTE — Assessment & Plan Note (Signed)
See family history

## 2021-12-25 NOTE — Progress Notes (Signed)
Subjective:    Patient ID: Jordan Tran, male    DOB: 11-21-1985, 36 y.o.   MRN: [id number]  Chief Complaint  Patient presents with   New Patient (Initial Visit)    New patient in office to establish care with PCP; no concerns to discuss; pt is due for CPE and labs    HPI 36 y.o. patient presents today for new patient establishment with me.  Patient hasn't had PCP in a long time. 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 One 35 year old son in track and basketball.   Current Care Team: None    Acute concerns: Family history of heart disease in father and paternal grandfather.  Health maintenance: Lifestyle/ exercise: Elliptical, lifting Nutrition: Cooking at home mostly, rare eating out Mental health: Doing well Caffeine: 1-2 Cups coffee in the morning; no sodas Sleep: Doing well overall Substance use: None  Sexual activity: No concerns, monogamous Immunizations: Doesn't like needles, declines flu vaccine    Past Medical History:  Diagnosis Date   H/O seasonal allergies     Past Surgical History:  Procedure Laterality Date   WISDOM TOOTH EXTRACTION Bilateral     Family History  Problem Relation Age of Onset   Sickle cell trait Mother    Diabetes Mother    Hypertension Mother    Heart disease Father        ultimately passed from COVID   Sickle cell trait Brother        passed away May 20, 2013 MVA   Diabetes Maternal Grandmother    Kidney disease Maternal Grandmother    Hypertension Maternal Grandmother    Hypertension Paternal Grandfather    Diabetes Paternal Grandfather    Heart disease Paternal Grandfather     Social History   Tobacco Use   Smoking status: Never   Smokeless tobacco: Never  Vaping Use   Vaping Use: Former  Substance Use Topics   Alcohol use: Yes    Comment: occasionally 1 per month   Drug use: No     No Known Allergies  Review of Systems NEGATIVE UNLESS OTHERWISE INDICATED IN HPI      Objective:     BP (!) 144/94 (BP Location: Right  Arm)   Pulse 85   Temp 97.8 F (36.6 C) (Temporal)   Ht 6' 0.05" (1.83 m)   Wt 299 lb 12.8 oz (136 kg)   SpO2 99%   BMI 40.61 kg/m   Wt Readings from Last 3 Encounters:  12/25/21 299 lb 12.8 oz (136 kg)  08/15/14 (!) 352 lb (159.7 kg)    BP Readings from Last 3 Encounters:  12/25/21 (!) 144/94  03/23/20 (!) 157/94  08/15/14 120/76     Physical Exam Vitals and nursing note reviewed.  Constitutional:      General: He is not in acute distress.    Appearance: Normal appearance. He is obese. He is not toxic-appearing.  HENT:     Head: Normocephalic and atraumatic.     Right Ear: Tympanic membrane, ear canal and external ear normal.     Left Ear: Tympanic membrane, ear canal and external ear normal.     Nose: Nose normal.     Mouth/Throat:     Mouth: Mucous membranes are moist.     Pharynx: Oropharynx is clear.  Eyes:     Extraocular Movements: Extraocular movements intact.     Conjunctiva/sclera: Conjunctivae normal.     Pupils: Pupils are equal, round, and reactive to light.  Cardiovascular:  Rate and Rhythm: Normal rate and regular rhythm.     Pulses: Normal pulses.     Heart sounds: Normal heart sounds.  Pulmonary:     Effort: Pulmonary effort is normal.     Breath sounds: Normal breath sounds.  Abdominal:     General: Abdomen is flat. Bowel sounds are normal.     Palpations: Abdomen is soft.     Tenderness: There is no abdominal tenderness.  Musculoskeletal:        General: Normal range of motion.     Cervical back: Normal range of motion and neck supple.  Skin:    General: Skin is warm and dry.  Neurological:     General: No focal deficit present.     Mental Status: He is alert and oriented to person, place, and time.  Psychiatric:        Mood and Affect: Mood normal.        Behavior: Behavior normal.        Assessment & Plan:  Encounter for annual physical exam Assessment & Plan: Age-appropriate screening and counseling performed today. Will  check labs (next visit, patient needle-phobic) and call with results. Preventive measures discussed and printed in AVS for patient.   Patient Counseling: [x]   Nutrition: Stressed importance of moderation in sodium/caffeine intake, saturated fat and cholesterol, caloric balance, sufficient intake of fresh fruits, vegetables, and fiber.  [x]   Stressed the importance of regular exercise.   [x]   Substance Abuse: Discussed cessation/primary prevention of tobacco, alcohol, or other drug use; driving or other dangerous activities under the influence; availability of treatment for abuse.   []   Injury prevention: Discussed safety belts, safety helmets, smoke detector, smoking near bedding or upholstery.   [x]   Sexuality: Discussed sexually transmitted diseases, partner selection, use of condoms, avoidance of unintended pregnancy  and contraceptive alternatives.   [x]   Dental health: Discussed importance of regular tooth brushing, flossing, and dental visits.  [x]   Health maintenance and immunizations reviewed. Please refer to Health maintenance section.        Elevated blood pressure reading in office without diagnosis of hypertension Assessment & Plan: Anxious today, says he usually has high BP at doctor's offices Asked him to try to check outside of our office, bring readings Lifestyle advised - good exercise / diet/ avoid ETOH Consider anti-HTN medication if persistent Monitor / recheck in 2 weeks   Family history of heart disease in male family member before age 42 Assessment & Plan: See family history          Return in about 2 weeks (around 01/08/2022) for BP recheck with Ac Colan and fasting labs .  This note was prepared with assistance of . Occasional wrong-word or sound-a-like substitutions may have occurred due to the inherent limitations of voice recognition software.      Sorcha Rotunno M Patsey Pitstick, PA-C

## 2021-12-25 NOTE — Assessment & Plan Note (Signed)
Anxious today, says he usually has high BP at doctor's offices Asked him to try to check outside of our office, bring readings Lifestyle advised - good exercise / diet/ avoid ETOH Consider anti-HTN medication if persistent Monitor / recheck in 2 weeks

## 2022-01-08 ENCOUNTER — Encounter: Payer: Self-pay | Admitting: Physician Assistant

## 2022-01-08 ENCOUNTER — Ambulatory Visit: Payer: Medicaid Other | Admitting: Physician Assistant

## 2022-01-08 VITALS — BP 126/77 | HR 84 | Temp 97.5°F | Ht 72.05 in | Wt 302.6 lb

## 2022-01-08 DIAGNOSIS — Z130 Encounter for screening for diseases of the blood and blood-forming organs and certain disorders involving the immune mechanism: Secondary | ICD-10-CM

## 2022-01-08 DIAGNOSIS — Z13220 Encounter for screening for lipoid disorders: Secondary | ICD-10-CM | POA: Diagnosis not present

## 2022-01-08 DIAGNOSIS — Z131 Encounter for screening for diabetes mellitus: Secondary | ICD-10-CM

## 2022-01-08 DIAGNOSIS — R030 Elevated blood-pressure reading, without diagnosis of hypertension: Secondary | ICD-10-CM

## 2022-01-08 LAB — CBC WITH DIFFERENTIAL/PLATELET
Basophils Absolute: 0 10*3/uL (ref 0.0–0.1)
Basophils Relative: 0.7 % (ref 0.0–3.0)
Eosinophils Absolute: 0.1 10*3/uL (ref 0.0–0.7)
Eosinophils Relative: 2 % (ref 0.0–5.0)
HCT: 50.1 % (ref 39.0–52.0)
Hemoglobin: 16.9 g/dL (ref 13.0–17.0)
Lymphocytes Relative: 54.4 % — ABNORMAL HIGH (ref 12.0–46.0)
Lymphs Abs: 3.8 10*3/uL (ref 0.7–4.0)
MCHC: 33.7 g/dL (ref 30.0–36.0)
MCV: 86.8 fl (ref 78.0–100.0)
Monocytes Absolute: 0.6 10*3/uL (ref 0.1–1.0)
Monocytes Relative: 8.9 % (ref 3.0–12.0)
Neutro Abs: 2.4 10*3/uL (ref 1.4–7.7)
Neutrophils Relative %: 34 % — ABNORMAL LOW (ref 43.0–77.0)
Platelets: 295 10*3/uL (ref 150.0–400.0)
RBC: 5.77 Mil/uL (ref 4.22–5.81)
RDW: 13.6 % (ref 11.5–15.5)
WBC: 7 10*3/uL (ref 4.0–10.5)

## 2022-01-08 LAB — LIPID PANEL
Cholesterol: 207 mg/dL — ABNORMAL HIGH (ref 0–200)
HDL: 52.8 mg/dL (ref >39.00)
LDL Cholesterol: 133 mg/dL — ABNORMAL HIGH (ref 0–99)
NonHDL: 154.55
Total CHOL/HDL Ratio: 4
Triglycerides: 106 mg/dL (ref 0.0–149.0)
VLDL: 21.2 mg/dL (ref 0.0–40.0)

## 2022-01-08 LAB — COMPREHENSIVE METABOLIC PANEL
ALT: 33 U/L (ref 0–53)
AST: 16 U/L (ref 0–37)
Albumin: 4.6 g/dL (ref 3.5–5.2)
Alkaline Phosphatase: 91 U/L (ref 39–117)
BUN: 10 mg/dL (ref 6–23)
CO2: 29 mEq/L (ref 19–32)
Calcium: 10 mg/dL (ref 8.4–10.5)
Chloride: 103 mEq/L (ref 96–112)
Creatinine, Ser: 1.01 mg/dL (ref 0.40–1.50)
GFR: 96.09 mL/min (ref >60.00)
Glucose, Bld: 100 mg/dL — ABNORMAL HIGH (ref 70–99)
Potassium: 4.9 mEq/L (ref 3.5–5.1)
Sodium: 140 mEq/L (ref 135–145)
Total Bilirubin: 1.2 mg/dL (ref 0.2–1.2)
Total Protein: 7.5 g/dL (ref 6.0–8.3)

## 2022-01-08 LAB — HEMOGLOBIN A1C: Hgb A1c MFr Bld: 6.9 % — ABNORMAL HIGH (ref 4.6–6.5)

## 2022-01-08 NOTE — Progress Notes (Signed)
Subjective:    Patient ID: Jordan Tran, male    DOB: 02-17-1985, 36 y.o.   MRN: [id number]  Chief Complaint  Patient presents with   Follow-up   Hypertension    Hypertension   Patient is in today for fasting labs and recheck BP. Doesn't have a way to check BP at home. Denies any symptoms or changes from last visit.   Past Medical History:  Diagnosis Date   H/O seasonal allergies     Past Surgical History:  Procedure Laterality Date   WISDOM TOOTH EXTRACTION Bilateral     Family History  Problem Relation Age of Onset   Sickle cell trait Mother    Diabetes Mother    Hypertension Mother    Heart disease Father        ultimately passed from COVID   Sickle cell trait Brother        passed away 2013/05/27 MVA   Diabetes Maternal Grandmother    Kidney disease Maternal Grandmother    Hypertension Maternal Grandmother    Hypertension Paternal Grandfather    Diabetes Paternal Grandfather    Heart disease Paternal Grandfather     Social History   Tobacco Use   Smoking status: Never   Smokeless tobacco: Never  Vaping Use   Vaping Use: Former  Substance Use Topics   Alcohol use: Yes    Comment: occasionally 1 per month   Drug use: No     No Known Allergies  Review of Systems NEGATIVE UNLESS OTHERWISE INDICATED IN HPI      Objective:     BP 126/77 (BP Location: Left Arm, Patient Position: Sitting)   Pulse 84   Temp (!) 97.5 F (36.4 C) (Temporal)   Ht 6' 0.05" (1.83 m)   Wt (!) 302 lb 9.6 oz (137.3 kg)   SpO2 97%   BMI 40.98 kg/m   Wt Readings from Last 3 Encounters:  01/08/22 (!) 302 lb 9.6 oz (137.3 kg)  12/25/21 299 lb 12.8 oz (136 kg)  08/15/14 (!) 352 lb (159.7 kg)    BP Readings from Last 3 Encounters:  01/08/22 126/77  12/25/21 (!) 144/94  03/23/20 (!) 157/94     Physical Exam Constitutional:      Appearance: Normal appearance. He is obese.  Cardiovascular:     Rate and Rhythm: Normal rate and regular rhythm.  Pulmonary:     Effort:  Pulmonary effort is normal.     Breath sounds: Normal breath sounds.  Neurological:     Mental Status: He is alert.  Psychiatric:        Mood and Affect: Mood normal.        Behavior: Behavior normal.        Assessment & Plan:  Elevated blood pressure reading in office without diagnosis of hypertension -     CBC with Differential/Platelet -     Comprehensive metabolic panel  Diabetes mellitus screening -     Comprehensive metabolic panel -     Hemoglobin A1c  Screening for cholesterol level -     Lipid panel  Screening for iron deficiency anemia -     CBC with Differential/Platelet   36 year old African-American male presented for recheck of blood pressure today after initial visit showed some elevated readings.  His blood pressure is of normal range.  Encouraged him to keep working on lifestyle changes to keep his blood pressure at goal.  Plan to check some screening fasting labs today and inform him of any  concerns.     Return in about 1 year (around 01/09/2023) for CPE, fasting labs .  This note was prepared with assistance of Conservation officer, historic buildings. Occasional wrong-word or sound-a-like substitutions may have occurred due to the inherent limitations of voice recognition software.     Ewing Fandino M Kadia Abaya, PA-C

## 2022-01-15 ENCOUNTER — Telehealth (INDEPENDENT_AMBULATORY_CARE_PROVIDER_SITE_OTHER): Payer: Medicaid Other | Admitting: Physician Assistant

## 2022-01-15 ENCOUNTER — Encounter: Payer: Self-pay | Admitting: Physician Assistant

## 2022-01-15 DIAGNOSIS — E119 Type 2 diabetes mellitus without complications: Secondary | ICD-10-CM | POA: Diagnosis not present

## 2022-01-15 DIAGNOSIS — E782 Mixed hyperlipidemia: Secondary | ICD-10-CM | POA: Diagnosis not present

## 2022-01-15 NOTE — Progress Notes (Signed)
   Virtual Visit via Video Note  I connected with  Jordan Tran  on 01/15/22 at 11:30 AM EST by a video enabled telemedicine application and verified that I am speaking with the correct person using two identifiers.  Location: Patient: home Provider: Nature conservation officer at [employer] Persons present: Patient and myself   I discussed the limitations of evaluation and management by telemedicine and the availability of in person appointments. The patient expressed understanding and agreed to proceed.   History of Present Illness:  36 year old male presents via virtual visit to discuss recent blood work that was done.  New diagnosis of diabetes.  Also has elevated cholesterol on labs.  Family history of diabetes, hypertension, heart disease. Breads and pastas; some snacking in the evening times on sugary snacks. Sweets are a weakness when these are present.    Observations/Objective:   Gen: Awake, alert, no acute distress Resp: Breathing is even and non-labored Psych: calm/pleasant demeanor Neuro: Alert and Oriented x 3, + facial symmetry, speech is clear.   Assessment and Plan:  1. New onset type 2 diabetes mellitus (HCC) Lab Results  Component Value Date   HGBA1C 6.9 (H) 01/08/2022   I have discussed with the patient the pathophysiology of diabetes. We went over the natural progression of the disease. We talked about both insulin resistance and insulin deficiency. We stressed the importance of lifestyle changes including diet and exercise. I explained the complications associated with diabetes including retinopathy, nephropathy, neuropathy as well as increased risk of cardiovascular disease. We went over the benefit seen with glycemic control.   I explained to the patient that diabetic patients are at higher than normal risk for amputations. The patient was informed that diabetes is the number one cause of non-traumatic amputations in [place]. The patient was advised to  look and examine their feet , wear proper fitting shoes and not to go barefoot.   Patient is wanting to avoid medications at this time and spend the next 3 months working very hard on lifestyle changes.  Plan to recheck his point-of-care A1c in office in 3 months.  Encouraged at least a 10 pound weight loss in that time.  2. Mixed hyperlipidemia  Lipid Panel     Component Value Date/Time   CHOL 207 (H) 01/08/2022 1052   TRIG 106.0 01/08/2022 1052   HDL 52.80 01/08/2022 1052   CHOLHDL 4 01/08/2022 1052   VLDL 21.2 01/08/2022 1052   LDLCALC 133 (H) 01/08/2022 1052   Again, discussed possible consequences of elevated cholesterol long-term.  He is going to work hard on lifestyle changes.  We can recheck his cholesterol panel in the next 3 to 6 months.  He needs to work on cholesterol friendly diet and increase his exercise.   Follow Up Instructions:    I discussed the assessment and treatment plan with the patient. The patient was provided an opportunity to ask questions and all were answered. The patient agreed with the plan and demonstrated an understanding of the instructions.   The patient was advised to call back or seek an in-person evaluation if the symptoms worsen or if the condition fails to improve as anticipated.  Nazyia Gaugh M Edwinna Rochette, PA-C

## 2022-01-16 DIAGNOSIS — E782 Mixed hyperlipidemia: Secondary | ICD-10-CM | POA: Insufficient documentation

## 2022-01-16 DIAGNOSIS — E119 Type 2 diabetes mellitus without complications: Secondary | ICD-10-CM | POA: Insufficient documentation

## 2023-05-15 DIAGNOSIS — F4323 Adjustment disorder with mixed anxiety and depressed mood: Secondary | ICD-10-CM | POA: Diagnosis not present

## 2023-05-16 DIAGNOSIS — F4323 Adjustment disorder with mixed anxiety and depressed mood: Secondary | ICD-10-CM | POA: Diagnosis not present

## 2023-05-23 DIAGNOSIS — F4323 Adjustment disorder with mixed anxiety and depressed mood: Secondary | ICD-10-CM | POA: Diagnosis not present

## 2023-05-30 DIAGNOSIS — F4323 Adjustment disorder with mixed anxiety and depressed mood: Secondary | ICD-10-CM | POA: Diagnosis not present

## 2023-06-05 DIAGNOSIS — F4323 Adjustment disorder with mixed anxiety and depressed mood: Secondary | ICD-10-CM | POA: Diagnosis not present

## 2023-06-11 DIAGNOSIS — F4323 Adjustment disorder with mixed anxiety and depressed mood: Secondary | ICD-10-CM | POA: Diagnosis not present

## 2023-06-21 DIAGNOSIS — F4323 Adjustment disorder with mixed anxiety and depressed mood: Secondary | ICD-10-CM | POA: Diagnosis not present

## 2023-06-23 DIAGNOSIS — F4323 Adjustment disorder with mixed anxiety and depressed mood: Secondary | ICD-10-CM | POA: Diagnosis not present

## 2023-06-30 DIAGNOSIS — F4323 Adjustment disorder with mixed anxiety and depressed mood: Secondary | ICD-10-CM | POA: Diagnosis not present

## 2023-07-23 DIAGNOSIS — F4323 Adjustment disorder with mixed anxiety and depressed mood: Secondary | ICD-10-CM | POA: Diagnosis not present

## 2023-07-30 DIAGNOSIS — F4323 Adjustment disorder with mixed anxiety and depressed mood: Secondary | ICD-10-CM | POA: Diagnosis not present

## 2023-08-06 DIAGNOSIS — F4323 Adjustment disorder with mixed anxiety and depressed mood: Secondary | ICD-10-CM | POA: Diagnosis not present

## 2023-08-11 DIAGNOSIS — F4323 Adjustment disorder with mixed anxiety and depressed mood: Secondary | ICD-10-CM | POA: Diagnosis not present

## 2023-09-14 DIAGNOSIS — F4323 Adjustment disorder with mixed anxiety and depressed mood: Secondary | ICD-10-CM | POA: Diagnosis not present

## 2023-10-06 DIAGNOSIS — F4323 Adjustment disorder with mixed anxiety and depressed mood: Secondary | ICD-10-CM | POA: Diagnosis not present

## 2023-10-25 DIAGNOSIS — F4323 Adjustment disorder with mixed anxiety and depressed mood: Secondary | ICD-10-CM | POA: Diagnosis not present

## 2023-12-02 DIAGNOSIS — F4323 Adjustment disorder with mixed anxiety and depressed mood: Secondary | ICD-10-CM | POA: Diagnosis not present
